# Patient Record
Sex: Female | Born: 1965 | Hispanic: No | Marital: Single | Smoking: Former smoker
Health system: Southern US, Community
[De-identification: ages and names within clinical notes are randomized; demographics above are authoritative.]

## PROBLEM LIST (undated history)

## (undated) DIAGNOSIS — S4292XA Fracture of left shoulder girdle, part unspecified, initial encounter for closed fracture: Secondary | ICD-10-CM

## (undated) HISTORY — PX: WISDOM TOOTH EXTRACTION: SHX21

## (undated) HISTORY — PX: ABDOMINAL HYSTERECTOMY: SHX81

## (undated) HISTORY — PX: APPENDECTOMY: SHX54

---

## 2008-06-04 ENCOUNTER — Ambulatory Visit (HOSPITAL_BASED_OUTPATIENT_CLINIC_OR_DEPARTMENT_OTHER): Admission: RE | Admit: 2008-06-04 | Discharge: 2008-06-04 | Payer: Self-pay | Admitting: Orthopedic Surgery
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
## 2017-09-07 ENCOUNTER — Emergency Department (HOSPITAL_COMMUNITY): Payer: 59

## 2017-09-07 ENCOUNTER — Emergency Department (HOSPITAL_COMMUNITY)
Admission: EM | Admit: 2017-09-07 | Discharge: 2017-09-07 | Disposition: A | Discharge status: Home / Self Care | Payer: 59 | Attending: Emergency Medicine | Admitting: Emergency Medicine

## 2017-09-07 ENCOUNTER — Other Ambulatory Visit: Payer: Self-pay

## 2017-09-07 ENCOUNTER — Encounter (HOSPITAL_COMMUNITY): Payer: Self-pay | Admitting: Emergency Medicine

## 2017-09-07 DIAGNOSIS — Y999 Unspecified external cause status: Secondary | ICD-10-CM | POA: Insufficient documentation

## 2017-09-07 DIAGNOSIS — S0990XA Unspecified injury of head, initial encounter: Secondary | ICD-10-CM | POA: Diagnosis present

## 2017-09-07 DIAGNOSIS — Z87891 Personal history of nicotine dependence: Secondary | ICD-10-CM | POA: Insufficient documentation

## 2017-09-07 DIAGNOSIS — Y92410 Unspecified street and highway as the place of occurrence of the external cause: Secondary | ICD-10-CM | POA: Diagnosis not present

## 2017-09-07 DIAGNOSIS — S0083XA Contusion of other part of head, initial encounter: Secondary | ICD-10-CM | POA: Insufficient documentation

## 2017-09-07 DIAGNOSIS — R079 Chest pain, unspecified: Secondary | ICD-10-CM | POA: Diagnosis not present

## 2017-09-07 DIAGNOSIS — T148XXA Other injury of unspecified body region, initial encounter: Secondary | ICD-10-CM

## 2017-09-07 DIAGNOSIS — M7918 Myalgia, other site: Secondary | ICD-10-CM | POA: Diagnosis not present

## 2017-09-07 DIAGNOSIS — Y9389 Activity, other specified: Secondary | ICD-10-CM | POA: Insufficient documentation

## 2017-09-07 HISTORY — DX: Fracture of left shoulder girdle, part unspecified, initial encounter for closed fracture: S42.92XA

## 2017-09-07 LAB — CBC WITH DIFFERENTIAL/PLATELET
Basophils Absolute: 0 10*3/uL (ref 0.0–0.1)
Basophils Relative: 0 %
Eosinophils Absolute: 0 10*3/uL (ref 0.0–0.7)
Eosinophils Relative: 1 %
HCT: 40.7 % (ref 36.0–46.0)
Hemoglobin: 13.9 g/dL (ref 12.0–15.0)
Lymphocytes Relative: 15 %
Lymphs Abs: 1.3 10*3/uL (ref 0.7–4.0)
MCH: 30.3 pg (ref 26.0–34.0)
MCHC: 34.2 g/dL (ref 30.0–36.0)
MCV: 88.7 fL (ref 78.0–100.0)
Monocytes Absolute: 0.5 10*3/uL (ref 0.1–1.0)
Monocytes Relative: 5 %
Neutro Abs: 7 10*3/uL (ref 1.7–7.7)
Neutrophils Relative %: 79 %
Platelets: 273 10*3/uL (ref 150–400)
RBC: 4.59 MIL/uL (ref 3.87–5.11)
RDW: 12.7 % (ref 11.5–15.5)
WBC: 8.8 10*3/uL (ref 4.0–10.5)

## 2017-09-07 LAB — BASIC METABOLIC PANEL
Anion gap: 7 (ref 5–15)
BUN: 20 mg/dL (ref 6–20)
CO2: 23 mmol/L (ref 22–32)
Calcium: 9.3 mg/dL (ref 8.9–10.3)
Chloride: 109 mmol/L (ref 101–111)
Creatinine, Ser: 0.85 mg/dL (ref 0.44–1.00)
GFR calc Af Amer: >60 mL/min (ref >60)
GFR calc non Af Amer: >60 mL/min (ref >60)
Glucose, Bld: 97 mg/dL (ref 65–99)
Potassium: 3.6 mmol/L (ref 3.5–5.1)
Sodium: 139 mmol/L (ref 135–145)

## 2017-09-07 LAB — TROPONIN I: Troponin I: <0.03 ng/mL (ref <0.03)

## 2017-09-07 LAB — I-STAT BETA HCG BLOOD, ED (MC, WL, AP ONLY): I-stat hCG, quantitative: 5.2 m[IU]/mL — ABNORMAL HIGH (ref <5)

## 2017-09-07 MED ORDER — IOPAMIDOL (ISOVUE-300) INJECTION 61%
75.0000 mL | Freq: Once | INTRAVENOUS | Status: AC | PRN
  Administered 2017-09-07: 75 mL via INTRAVENOUS

## 2017-09-07 MED ORDER — IOPAMIDOL (ISOVUE-300) INJECTION 61%
INTRAVENOUS | Status: AC
  Filled 2017-09-07: qty 75

## 2017-09-07 MED ORDER — ACETAMINOPHEN 325 MG PO TABS
650.0000 mg | ORAL_TABLET | Freq: Once | ORAL | Status: AC
  Administered 2017-09-07: 650 mg via ORAL
  Filled 2017-09-07: qty 2

## 2017-09-07 MED ORDER — TRAMADOL HCL 50 MG PO TABS: 50.0000 mg | ORAL_TABLET | Freq: Four times a day (QID) | ORAL | 0 refills | Status: AC | PRN

## 2017-09-07 MED ORDER — SODIUM CHLORIDE 0.9 % IJ SOLN
INTRAMUSCULAR | Status: AC
  Filled 2017-09-07: qty 50

## 2017-09-07 MED ORDER — CYCLOBENZAPRINE HCL 10 MG PO TABS: 10.0000 mg | ORAL_TABLET | Freq: Two times a day (BID) | ORAL | 0 refills | Status: AC | PRN
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
## 2017-09-07 NOTE — Discharge Instructions (Addendum)
Please read attached information. If you experience any new or worsening signs or symptoms please return to the emergency room for evaluation. Please follow-up with your primary care provider or specialist as discussed. Please use medication prescribed only as directed and discontinue taking if you have any concerning signs or symptoms.   °
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
## 2017-09-07 NOTE — ED Notes (Signed)
Bed: WTR8 Expected date:  Expected time:  Means of arrival:  Comments: 

## 2018-10-09 ENCOUNTER — Emergency Department (HOSPITAL_COMMUNITY)
Admission: EM | Admit: 2018-10-09 | Discharge: 2018-10-09 | Disposition: A | Discharge status: Home / Self Care | Payer: No Typology Code available for payment source | Attending: Emergency Medicine | Admitting: Emergency Medicine

## 2018-10-09 ENCOUNTER — Emergency Department (HOSPITAL_COMMUNITY): Payer: No Typology Code available for payment source

## 2018-10-09 ENCOUNTER — Encounter (HOSPITAL_COMMUNITY): Payer: Self-pay | Admitting: Emergency Medicine

## 2018-10-09 DIAGNOSIS — Z87891 Personal history of nicotine dependence: Secondary | ICD-10-CM | POA: Insufficient documentation

## 2018-10-09 DIAGNOSIS — S0993XA Unspecified injury of face, initial encounter: Secondary | ICD-10-CM | POA: Diagnosis present

## 2018-10-09 DIAGNOSIS — Y9389 Activity, other specified: Secondary | ICD-10-CM | POA: Insufficient documentation

## 2018-10-09 DIAGNOSIS — W19XXXA Unspecified fall, initial encounter: Secondary | ICD-10-CM

## 2018-10-09 DIAGNOSIS — Y990 Civilian activity done for income or pay: Secondary | ICD-10-CM | POA: Insufficient documentation

## 2018-10-09 DIAGNOSIS — S0081XA Abrasion of other part of head, initial encounter: Secondary | ICD-10-CM | POA: Diagnosis not present

## 2018-10-09 DIAGNOSIS — S0083XA Contusion of other part of head, initial encounter: Secondary | ICD-10-CM | POA: Diagnosis not present

## 2018-10-09 DIAGNOSIS — W01198A Fall on same level from slipping, tripping and stumbling with subsequent striking against other object, initial encounter: Secondary | ICD-10-CM | POA: Insufficient documentation

## 2018-10-09 DIAGNOSIS — Y929 Unspecified place or not applicable: Secondary | ICD-10-CM | POA: Diagnosis not present

## 2018-10-09 MED ORDER — ONDANSETRON 4 MG PO TBDP
4.0000 mg | ORAL_TABLET | Freq: Once | ORAL | Status: AC
  Administered 2018-10-09: 4 mg via ORAL
  Filled 2018-10-09: qty 1

## 2018-10-09 MED ORDER — OXYCODONE-ACETAMINOPHEN 5-325 MG PO TABS
1.0000 | ORAL_TABLET | ORAL | Status: DC | PRN
  Administered 2018-10-09: 1 via ORAL
  Filled 2018-10-09: qty 1

## 2018-10-09 NOTE — ED Triage Notes (Addendum)
Patient here from home with complaints of fall today, reports hitting face on door. Complains of right sided facial pain and headache. Denies n/v.

## 2018-10-09 NOTE — ED Provider Notes (Signed)
[facility]-EMERGENCY DEPT Provider Note   CSN: [id number] Arrival date & time: 10/09/18  0957     History   Chief Complaint Chief Complaint  Patient presents with  . Fall  . Facial Pain  . Headache    HPI Evelyn Brooks is a 53 y.o. female who presents to the ED s/p fall with c/o facial pain and headache. Patient reports she stepped on something causing her to slip and she grabbed for the door handle but the door came open and she hit the right side of her head and face on the door facing.  No LOC or other injuries. Patient reports being up to date on tetanus.  HPI  Past Medical History:  Diagnosis Date  . Shoulder fracture, left     There are no active problems to display for this patient.   Past Surgical History:  Procedure Laterality Date  . ABDOMINAL HYSTERECTOMY    . APPENDECTOMY    . WISDOM TOOTH EXTRACTION       OB History   No obstetric history on file.      Home Medications    Prior to Admission medications   Medication Sig Start Date End Date Taking? Authorizing Provider  cyclobenzaprine (FLEXERIL) 10 MG tablet Take 1 tablet (10 mg total) by mouth 2 (two) times daily as needed for muscle spasms. 09/07/17   Hedges, Tinnie Gens, PA-C  traMADol (ULTRAM) 50 MG tablet Take 1 tablet (50 mg total) by mouth every 6 (six) hours as needed. 09/07/17   Eyvonne Mechanic, PA-C    Family History Family History  Problem Relation Age of Onset  . Diabetes Mother   . Heart failure Father     Social History Social History   Tobacco Use  . Smoking status: Former Games developer  . Smokeless tobacco: Never Used  Substance Use Topics  . Alcohol use: Yes    Comment: rare  . Drug use: No     Allergies   Codeine and Warfarin and related   Review of Systems Review of Systems  HENT: Positive for facial swelling.   Gastrointestinal: Positive for nausea.  Skin: Positive for wound.  Neurological: Positive for headaches.  All other systems reviewed and  are negative.    Physical Exam Updated Vital Signs BP (!) 119/91 (BP Location: Right Arm)   Pulse 71   Temp 98 F (36.7 C) (Oral)   Resp 18   SpO2 100%   Physical Exam Vitals signs and nursing note reviewed.  Constitutional:      General: She is not in acute distress.    Appearance: She is well-developed.  HENT:     Head: Contusion present.     Jaw: Pain on movement (right side) present. No trismus.      Comments: Tenderness, swelling and abrasion to the right forehead.     Right Ear: Tympanic membrane normal.     Left Ear: Tympanic membrane normal.     Nose: Nose normal.     Mouth/Throat:     Dentition: Normal dentition.     Pharynx: Oropharynx is clear.  Neck:     Musculoskeletal: Neck supple.  Cardiovascular:     Rate and Rhythm: Normal rate.  Pulmonary:     Effort: Pulmonary effort is normal.  Chest:     Chest wall: No tenderness.  Abdominal:     Palpations: Abdomen is soft.     Tenderness: There is no abdominal tenderness.  Musculoskeletal: Normal range of motion.  Skin:  General: Skin is warm and dry.  Neurological:     Mental Status: She is alert and oriented to person, place, and time.     Cranial Nerves: No cranial nerve deficit.     Sensory: Sensation is intact.     Motor: Motor function is intact. No weakness.     Coordination: Romberg sign negative. Finger-Nose-Finger Test normal.     Gait: Gait normal.     Deep Tendon Reflexes:     Reflex Scores:      Bicep reflexes are 2+ on the right side and 2+ on the left side.      Brachioradialis reflexes are 2+ on the right side and 2+ on the left side.      Patellar reflexes are 2+ on the right side and 2+ on the left side. Psychiatric:        Mood and Affect: Mood normal.      ED Treatments / Results  Labs (all labs ordered are listed, but only abnormal results are displayed) Labs Reviewed - No data to display  Radiology Ct Maxillofacial Wo Contrast  Result Date: 10/09/2018 CLINICAL DATA:   53 year old female with facial pain after trauma EXAM: CT MAXILLOFACIAL WITHOUT CONTRAST TECHNIQUE: Multidetector CT imaging of the maxillofacial structures was performed. Multiplanar CT image reconstructions were also generated. COMPARISON:  09/07/2017, 05/10/2016 FINDINGS: Osseous: No fracture or mandibular dislocation. No destructive process. Orbits: Negative. No traumatic or inflammatory finding. Sinuses: Clear. Soft tissues: Negative. Limited intracranial: No significant or unexpected finding. IMPRESSION: Negative maxillofacial CT Electronically Signed   By: Gilmer MorJaime  Wagner D.O.   On: 10/09/2018 14:07    Procedures Procedures (including critical care time)  Medications Ordered in ED Medications  oxyCODONE-acetaminophen (PERCOCET/ROXICET) 5-325 MG per tablet 1 tablet (1 tablet Oral Given 10/09/18 1043)  ondansetron (ZOFRAN-ODT) disintegrating tablet 4 mg (4 mg Oral Given 10/09/18 1304)     Initial Impression / Assessment and Plan / ED Course  I have reviewed the triage vital signs and the nursing notes. 53 y.o. female here with headache and facial pain s/p fall and hitting her face stable for d/c without acute findings on CT scan. Patient d/c home with RICE protocol and tylenol and ibuprofen as needed. Return precautions discussed.   Final Clinical Impressions(s) / ED Diagnoses   Final diagnoses:  Facial contusion, initial encounter  Fall, initial encounter  Facial abrasion, initial encounter    ED Discharge Orders    None       Kerrie Buffaloeese, Eilidh Marcano [place], [place] 10/09/18 1419    Mancel BaleWentz, Elliott, MD 10/09/18 1531

## 2018-10-09 NOTE — Discharge Instructions (Signed)
Take tylenol and ibuprofen as needed for pain. Follow up with your doctor or return here for worsening symptoms.  °

## 2020-03-11 ENCOUNTER — Emergency Department (HOSPITAL_COMMUNITY): Payer: 59

## 2020-03-11 ENCOUNTER — Emergency Department (HOSPITAL_COMMUNITY)
Admission: EM | Admit: 2020-03-11 | Discharge: 2020-03-11 | Disposition: A | Discharge status: Home / Self Care | Payer: 59 | Attending: Emergency Medicine | Admitting: Emergency Medicine

## 2020-03-11 ENCOUNTER — Encounter (HOSPITAL_COMMUNITY): Payer: Self-pay | Admitting: Emergency Medicine

## 2020-03-11 ENCOUNTER — Other Ambulatory Visit: Payer: Self-pay

## 2020-03-11 DIAGNOSIS — X500XXA Overexertion from strenuous movement or load, initial encounter: Secondary | ICD-10-CM | POA: Diagnosis not present

## 2020-03-11 DIAGNOSIS — Z87891 Personal history of nicotine dependence: Secondary | ICD-10-CM | POA: Insufficient documentation

## 2020-03-11 DIAGNOSIS — Y929 Unspecified place or not applicable: Secondary | ICD-10-CM | POA: Insufficient documentation

## 2020-03-11 DIAGNOSIS — Y999 Unspecified external cause status: Secondary | ICD-10-CM | POA: Diagnosis not present

## 2020-03-11 DIAGNOSIS — S6991XA Unspecified injury of right wrist, hand and finger(s), initial encounter: Secondary | ICD-10-CM | POA: Insufficient documentation

## 2020-03-11 DIAGNOSIS — Z7982 Long term (current) use of aspirin: Secondary | ICD-10-CM | POA: Insufficient documentation

## 2020-03-11 DIAGNOSIS — Y939 Activity, unspecified: Secondary | ICD-10-CM | POA: Diagnosis not present

## 2020-03-11 NOTE — ED Notes (Signed)
Patient verbalizes understanding of discharge instructions. Opportunity for questioning and answers were provided. Armband removed by staff, pt discharged from ED.  

## 2020-03-11 NOTE — ED Triage Notes (Signed)
Pt was moving boxes of paper prior to arrival and felt R wrist pop.  Pain and swelling to R wrist.

## 2020-03-11 NOTE — ED Provider Notes (Signed)
[facility] EMERGENCY DEPARTMENT Provider Note   CSN: [id number] Arrival date & time: 03/11/20  1322     History Chief Complaint  Patient presents with  . Wrist Pain    Evelyn Brooks is a 54 y.o. female.  HPI Patient presents with right wrist pain.  States she was moving some boxes and on the last box felt a pop in her right wrist.  States it was swelling immediately to the dorsum of the right wrist.  Moderate mild pain.  States swelling and pain improved somewhat with ice.  States she has had previous surgery on her left wrist for carpal tunnel.  No numbness or weakness.  No other injury.  She sees an orthopedic surgeon down in [place] where she is from.    Past Medical History:  Diagnosis Date  . Shoulder fracture, left     There are no problems to display for this patient.   Past Surgical History:  Procedure Laterality Date  . ABDOMINAL HYSTERECTOMY    . APPENDECTOMY    . WISDOM TOOTH EXTRACTION       OB History   No obstetric history on file.     Family History  Problem Relation Age of Onset  . Diabetes Mother   . Heart failure Father     Social History   Tobacco Use  . Smoking status: Former Games developer  . Smokeless tobacco: Never Used  Substance Use Topics  . Alcohol use: Yes    Comment: rare  . Drug use: No    Home Medications Prior to Admission medications   Medication Sig Start Date End Date Taking? Authorizing Provider  Aspirin-Acetaminophen-Caffeine (GOODY HEADACHE PO) Take 1 packet by mouth as needed (for pain).  07/21/13  Yes [provider]  ibuprofen (ADVIL) 200 MG tablet Take 400-600 mg by mouth every 6 (six) hours as needed for headache or mild pain.   Yes [provider]  cyclobenzaprine (FLEXERIL) 10 MG tablet Take 1 tablet (10 mg total) by mouth 2 (two) times daily as needed for muscle spasms. Patient not taking: Reported on 03/11/2020 09/07/17   Hedges, Tinnie Gens, PA-C  phentermine (ADIPEX-P) 37.5 MG  tablet Take 37.5 mg by mouth daily. Patient not taking: Reported on 03/11/2020 09/05/18   [provider]  rosuvastatin (CRESTOR) 10 MG tablet Take 10 mg by mouth daily. Patient not taking: Reported on 03/11/2020    [provider]  topiramate (TOPAMAX) 100 MG tablet Take 100 mg by mouth at bedtime. Patient not taking: Reported on 03/11/2020 09/05/18   [provider]  traMADol (ULTRAM) 50 MG tablet Take 1 tablet (50 mg total) by mouth every 6 (six) hours as needed. Patient not taking: Reported on 03/11/2020 09/07/17   Hedges, Tinnie Gens, PA-C  TRULICITY 1.5 MG/0.5ML SOPN Inject 1.5 mg into the skin once a week. Patient not taking: Reported on 03/11/2020 09/29/18   [provider]    Allergies    Bee venom, Oxycodone-acetaminophen, Codeine, and Warfarin and related  Review of Systems   Review of Systems  Constitutional: Negative for fever.  Musculoskeletal: Positive for joint swelling.       Right wrist pain.  Skin: Negative for wound.  Neurological: Negative for weakness and numbness.    Physical Exam Updated Vital Signs BP 117/67   Pulse 75   Temp 99.2 F (37.3 C) (Oral)   Resp 17   SpO2 100%   Physical Exam Vitals and nursing note reviewed.  HENT:  Head: Atraumatic.  Musculoskeletal:     Comments: Some tenderness to the dorsum of the right distal forearm/wrist.  Some ecchymosis and swelling.  Sensation intact grossly over hand.  Strong radial pulse.  Decreased range of motion the wrist due to pain.  Skin:    General: Skin is warm.     Capillary Refill: Capillary refill takes less than 2 seconds.  Neurological:     Mental Status: She is alert and oriented to person, place, and time.     ED Results / Procedures / Treatments   Labs (all labs ordered are listed, but only abnormal results are displayed) Labs Reviewed - No data to display  EKG None  Radiology DG Wrist Complete Right  Result Date: 03/11/2020 CLINICAL DATA:  Right wrist  pain. EXAM: RIGHT WRIST - COMPLETE 3+ VIEW COMPARISON:  MRI wrist report 07/14/2003 FINDINGS: Mild short ulna. Chronic fracture ulnar styloid which was described on the prior MRI. No acute fracture. Widening of the scapholunate joint compatible with ligament injury. This was described on the prior MRI report. IMPRESSION: No acute fracture.  Chronic wrist injury. Electronically Signed   By: Marlan Palau M.D.   On: 03/11/2020 14:46    Procedures Procedures (including critical care time)  Medications Ordered in ED Medications - No data to display  ED Course  I have reviewed the triage vital signs and the nursing notes.  Pertinent labs & imaging results that were available during my care of the patient were reviewed by me and considered in my medical decision making (see chart for details).    MDM Rules/Calculators/A&P                          Patient with right wrist injury.  States lifting a box and felt a pop.  X-ray reassuring but does have some scapholunate widening.  However similar to previous MRI.  Some dorsal ecchymosis swelling just mildly proximal from the wrist.  No tenderness more proximally to this however.  Will give splint and have follow-up with either hand surgery here or her orthopedic surgeon down in [place].  Discharge home. Final Clinical Impression(s) / ED Diagnoses Final diagnoses:  Wrist injury, right, initial encounter    Rx / DC Orders ED Discharge Orders    None       Benjiman Core, MD 03/11/20 1732

## 2020-03-11 NOTE — Discharge Instructions (Addendum)
Follow-up with either your orthopedic surgeon or the hand surgeon above.

## 2021-02-25 IMAGING — DX DG WRIST COMPLETE 3+V*R*
4 series · 4 of 4 positions shown · non-contrast
Comparison: MRI wrist report 07/14/2003

CLINICAL DATA: Right wrist pain.

EXAM:
RIGHT WRIST - COMPLETE 3+ VIEW

[wrist pa]
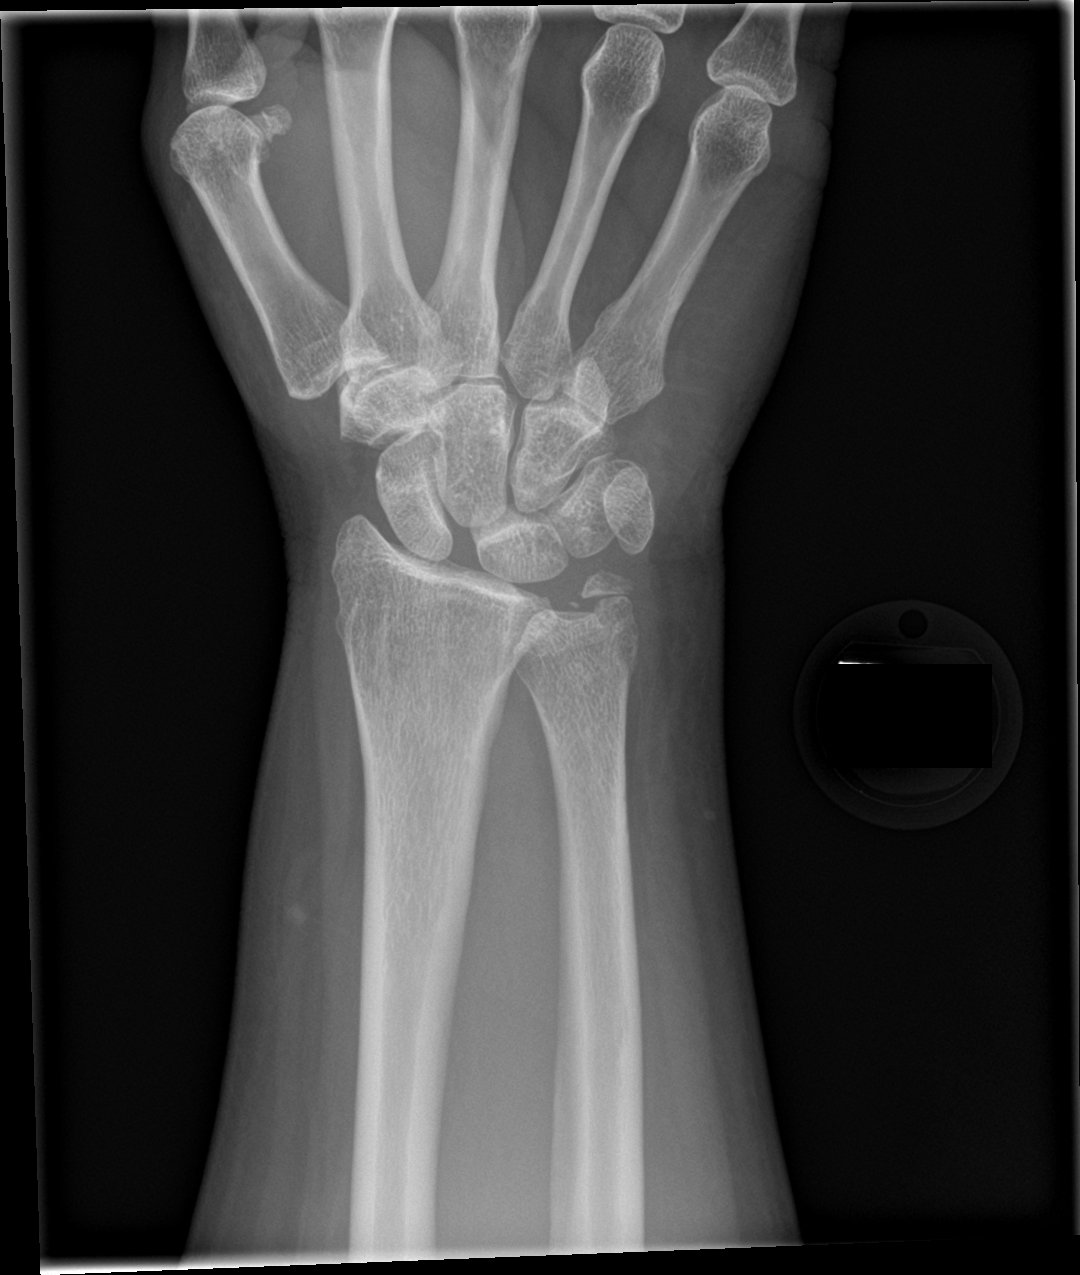

[wrist obl]
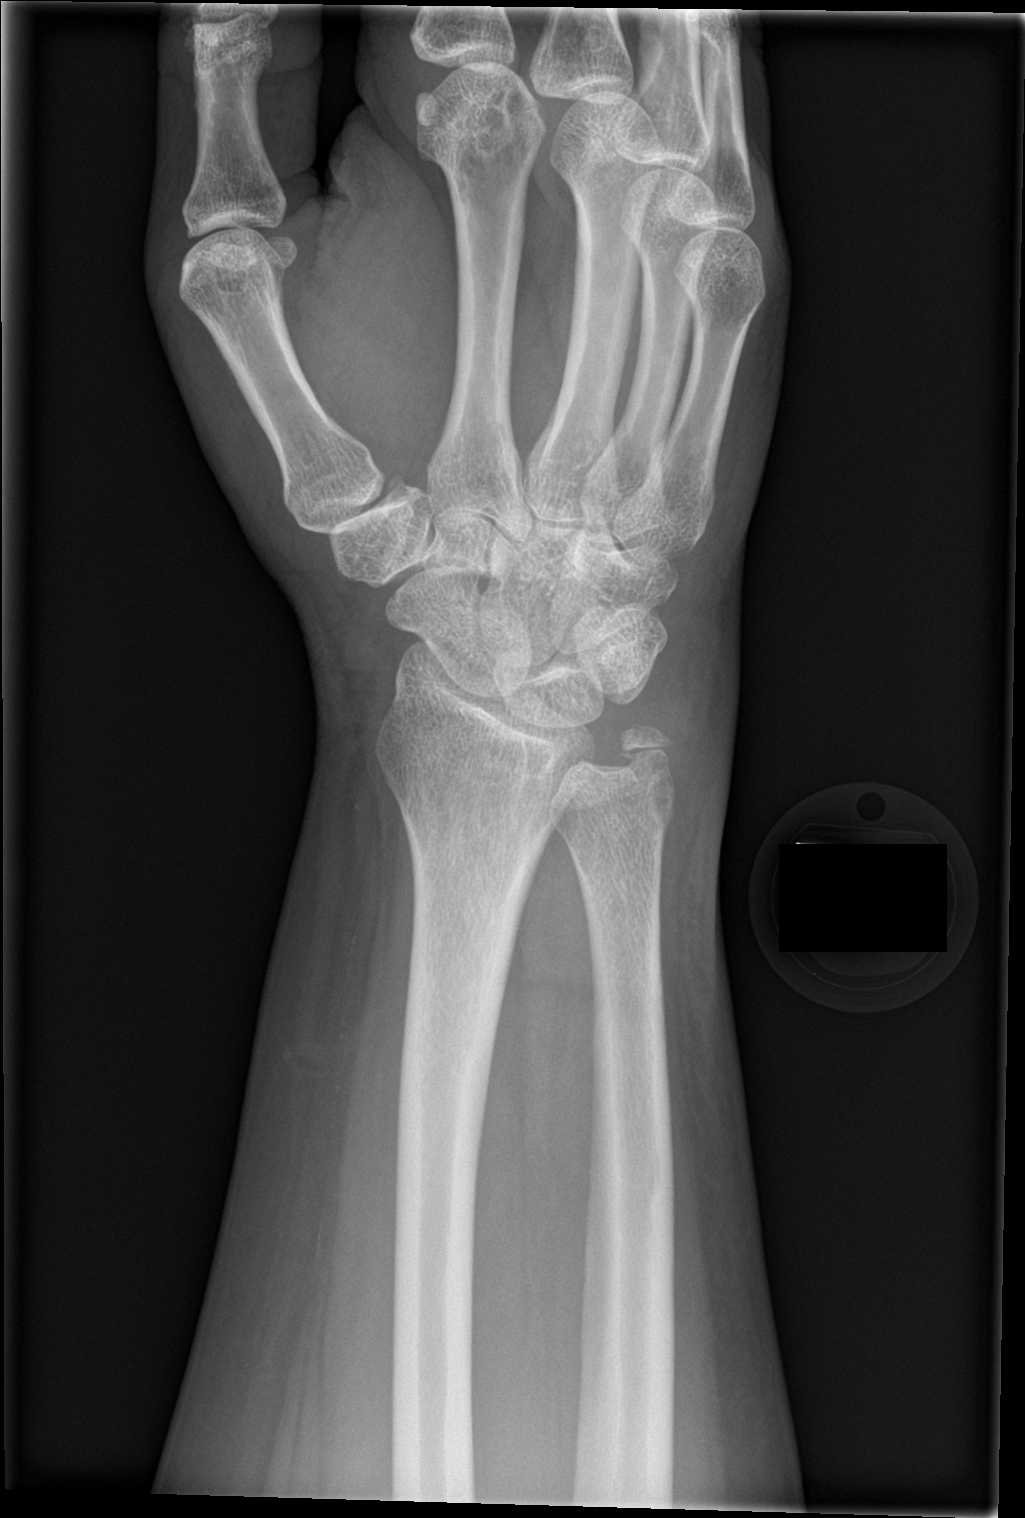

[wrist lat]
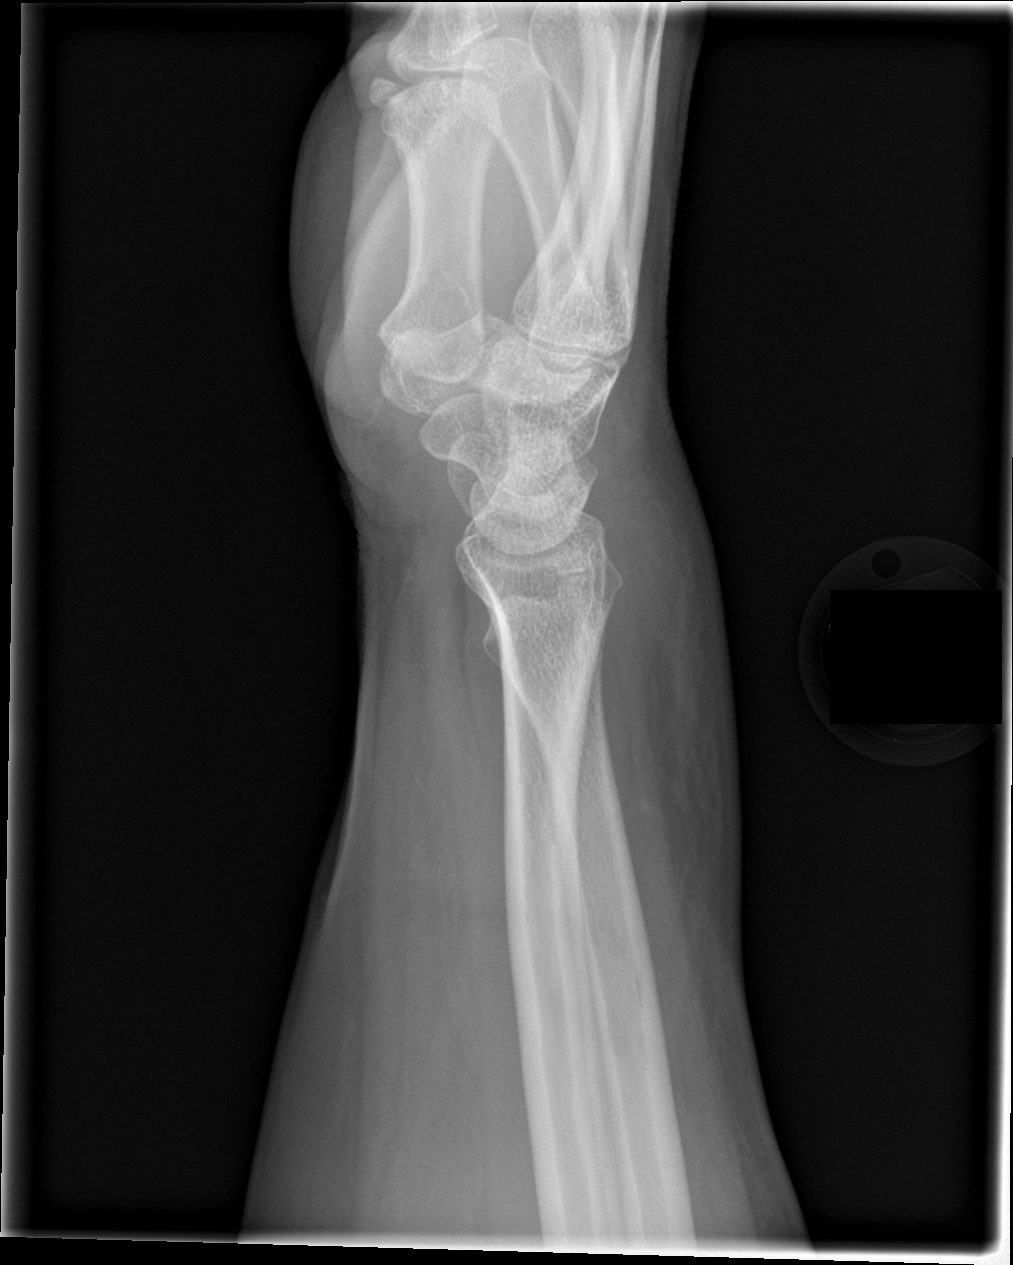

[wrist navicular]
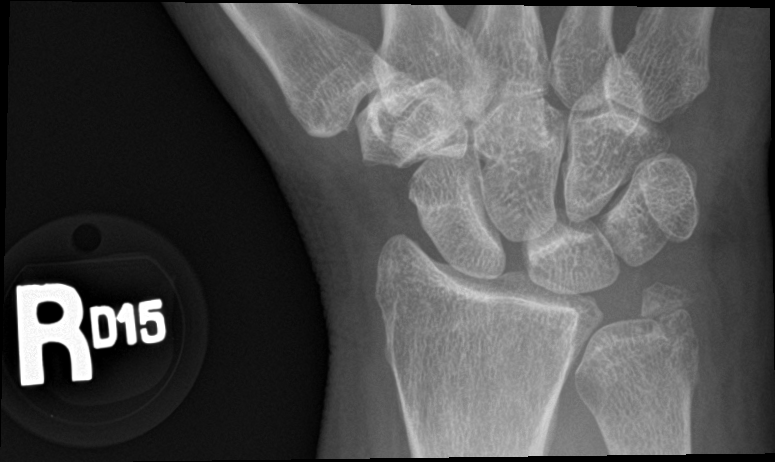

[4 of 4 positions shown; findings below may reference images not displayed]

FINDINGS: Mild short ulna. Chronic fracture ulnar styloid which was described
on the prior MRI.

No acute fracture.

Widening of the scapholunate joint compatible with ligament injury.
This was described on the prior MRI report.
IMPRESSION: No acute fracture.  Chronic wrist injury.
